# Patient Record
Sex: Male | Born: 1970 | Race: White | Hispanic: No | Marital: Married | Smoking: Current some day smoker
Health system: Southern US, Community
[De-identification: ages and names within clinical notes are randomized; demographics above are authoritative.]

---

## 1999-05-15 ENCOUNTER — Emergency Department (HOSPITAL_COMMUNITY): Admission: EM | Admit: 1999-05-15 | Discharge: 1999-05-15 | Payer: Self-pay | Admitting: Emergency Medicine

## 2020-07-31 ENCOUNTER — Emergency Department (HOSPITAL_COMMUNITY): Payer: 59

## 2020-07-31 ENCOUNTER — Other Ambulatory Visit: Payer: Self-pay

## 2020-07-31 ENCOUNTER — Emergency Department (HOSPITAL_COMMUNITY)
Admission: EM | Admit: 2020-07-31 | Discharge: 2020-07-31 | Disposition: A | Discharge status: Home / Self Care | Payer: 59 | Attending: Emergency Medicine | Admitting: Emergency Medicine

## 2020-07-31 ENCOUNTER — Encounter (HOSPITAL_COMMUNITY): Payer: Self-pay

## 2020-07-31 DIAGNOSIS — Z5321 Procedure and treatment not carried out due to patient leaving prior to being seen by health care provider: Secondary | ICD-10-CM | POA: Diagnosis not present

## 2020-07-31 DIAGNOSIS — R5383 Other fatigue: Secondary | ICD-10-CM | POA: Insufficient documentation

## 2020-07-31 DIAGNOSIS — R002 Palpitations: Secondary | ICD-10-CM | POA: Insufficient documentation

## 2020-07-31 LAB — CBC
HCT: 41.7 % (ref 39.0–52.0)
Hemoglobin: 14.6 g/dL (ref 13.0–17.0)
MCH: 30 pg (ref 26.0–34.0)
MCHC: 35 g/dL (ref 30.0–36.0)
MCV: 85.6 fL (ref 80.0–100.0)
Platelets: 199 10*3/uL (ref 150–400)
RBC: 4.87 MIL/uL (ref 4.22–5.81)
RDW: 13.2 % (ref 11.5–15.5)
WBC: 6.6 10*3/uL (ref 4.0–10.5)
nRBC: 0 % (ref 0.0–0.2)

## 2020-07-31 LAB — BASIC METABOLIC PANEL
Anion gap: 13 (ref 5–15)
BUN: <5 mg/dL — ABNORMAL LOW (ref 6–20)
CO2: 24 mmol/L (ref 22–32)
Calcium: 9.3 mg/dL (ref 8.9–10.3)
Chloride: 93 mmol/L — ABNORMAL LOW (ref 98–111)
Creatinine, Ser: 0.67 mg/dL (ref 0.61–1.24)
GFR calc Af Amer: >60 mL/min (ref >60)
GFR calc non Af Amer: >60 mL/min (ref >60)
Glucose, Bld: 92 mg/dL (ref 70–99)
Potassium: 4 mmol/L (ref 3.5–5.1)
Sodium: 130 mmol/L — ABNORMAL LOW (ref 135–145)

## 2020-07-31 LAB — TROPONIN I (HIGH SENSITIVITY): Troponin I (High Sensitivity): 8 ng/L (ref <18)

## 2020-07-31 NOTE — ED Triage Notes (Signed)
Patient states, "I feel like my heart is coming out of my chest and I feel tired." Patient states it started an hour ago.

## 2021-10-12 IMAGING — CR DG CHEST 2V
2 series · 2 of 2 positions shown · non-contrast
Comparison: No pertinent prior exams are available for comparison.

CLINICAL DATA: Chest pain and palpitations.

EXAM:
CHEST - 2 VIEW

[w chest pa]
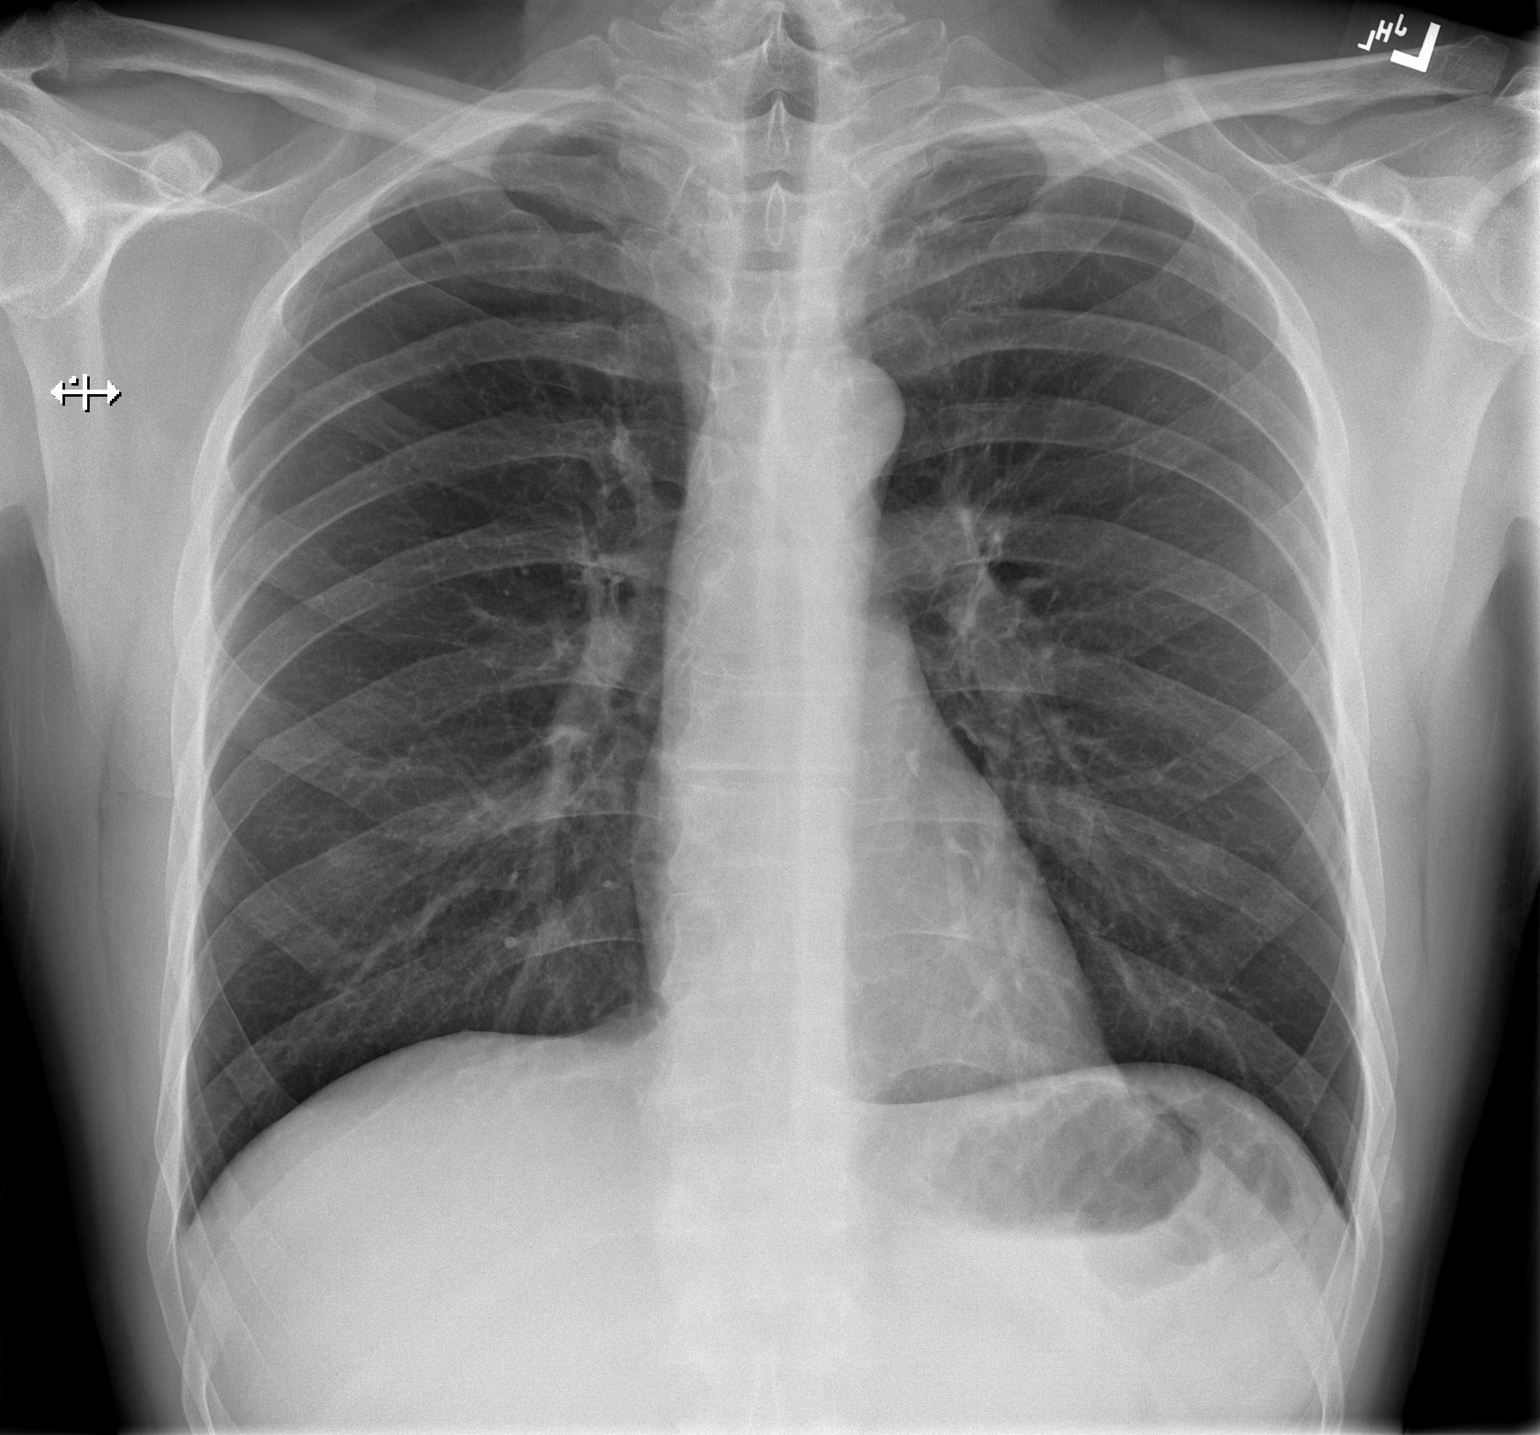

[w chest lat]
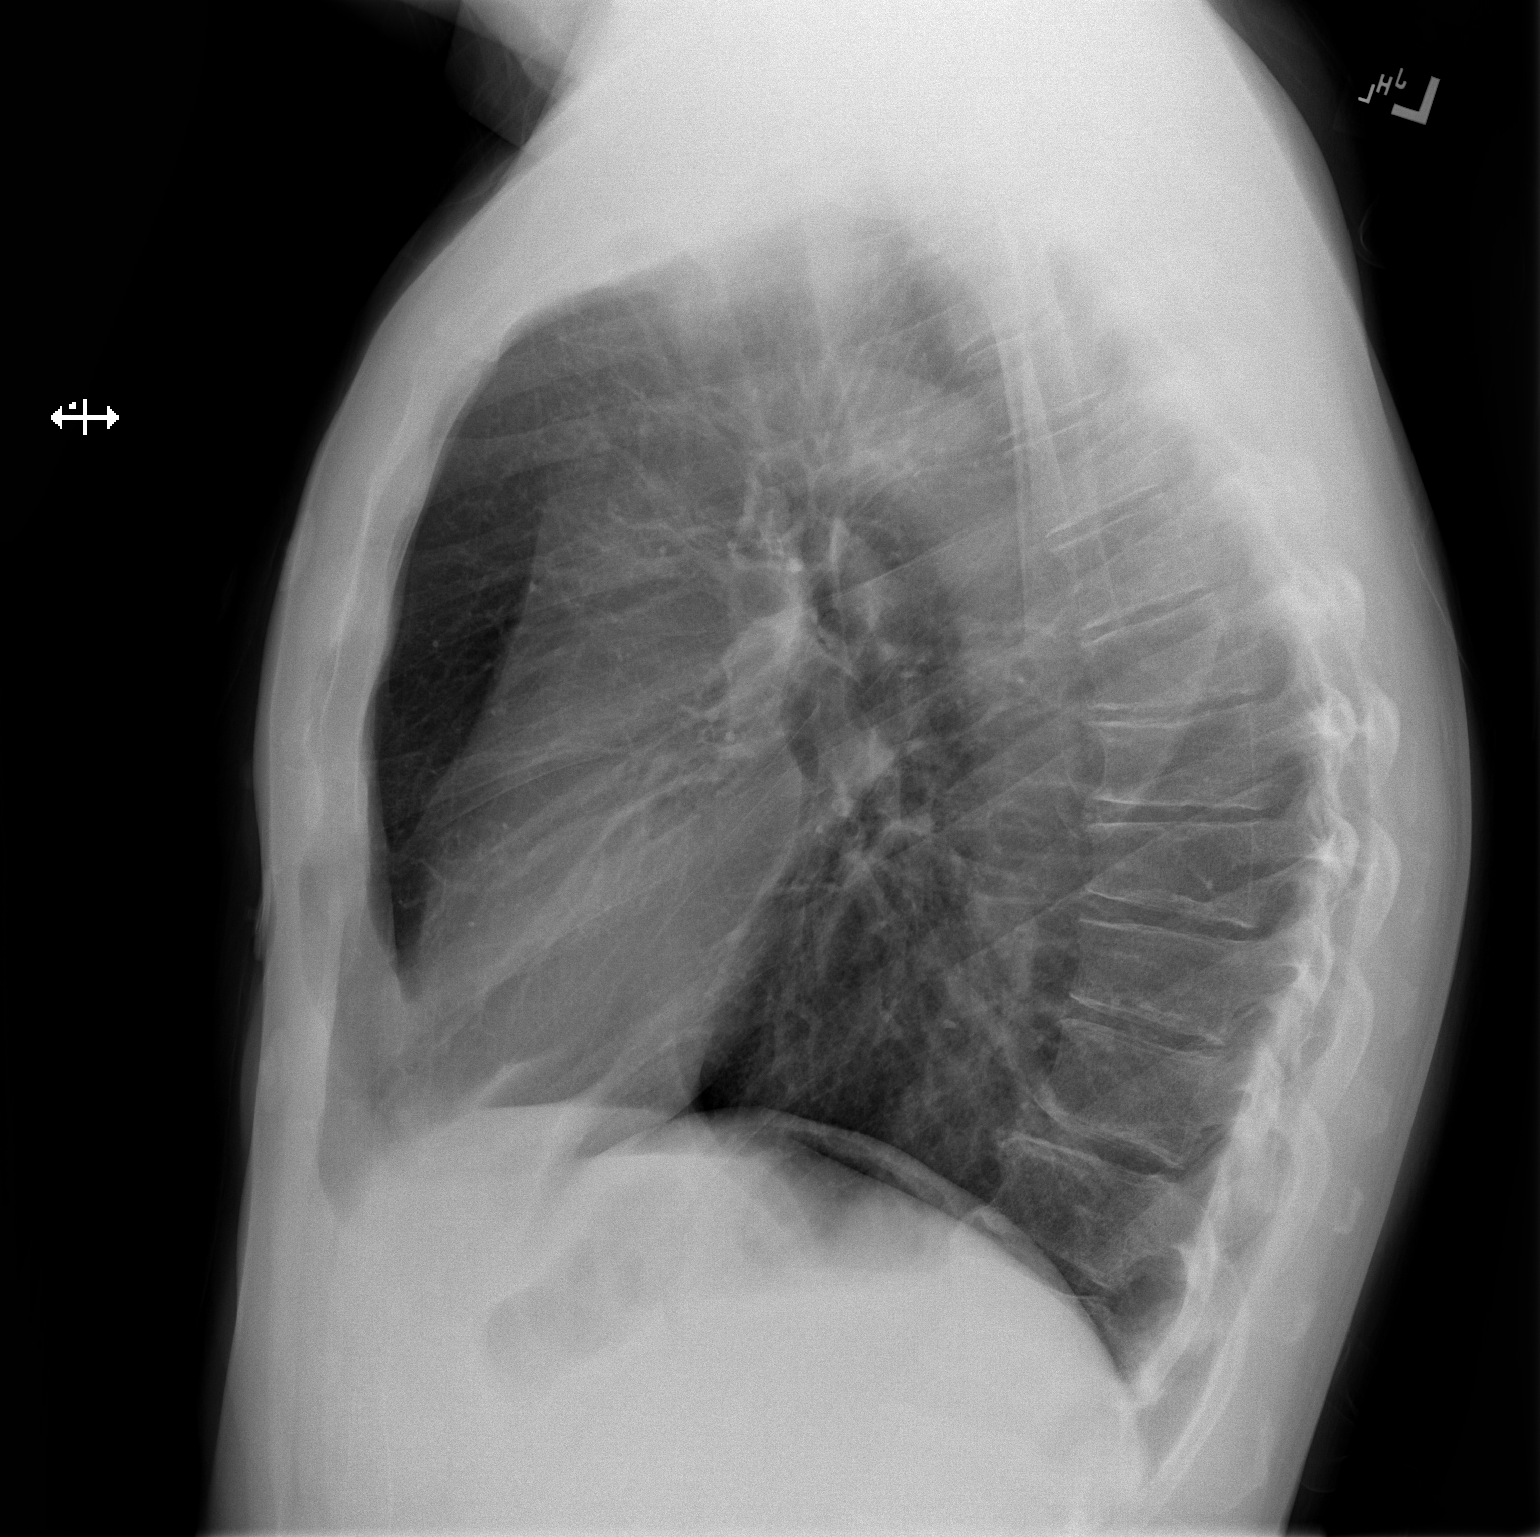

[2 of 2 positions shown; findings below may reference images not displayed]

FINDINGS: Heart size within normal limits.

There is no appreciable airspace consolidation.

No evidence of pleural effusion or pneumothorax.

No acute bony abnormality identified.
IMPRESSION: No evidence of active cardiopulmonary disease.
